# Patient Record
Sex: Male | Born: 1990 | Race: Black or African American | Hispanic: No | Marital: Single | Smoking: Never smoker
Health system: Southern US, Community
[De-identification: ages and names within clinical notes are randomized; demographics above are authoritative.]

---
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
## 2013-06-09 ENCOUNTER — Emergency Department (HOSPITAL_COMMUNITY)
Admission: EM | Admit: 2013-06-09 | Discharge: 2013-06-09 | Disposition: A | Discharge status: Home / Self Care | Payer: Managed Care, Other (non HMO) | Attending: Emergency Medicine | Admitting: Emergency Medicine

## 2013-06-09 ENCOUNTER — Encounter (HOSPITAL_COMMUNITY): Payer: Self-pay | Admitting: Emergency Medicine

## 2013-06-09 DIAGNOSIS — L03319 Cellulitis of trunk, unspecified: Principal | ICD-10-CM

## 2013-06-09 DIAGNOSIS — L02219 Cutaneous abscess of trunk, unspecified: Secondary | ICD-10-CM | POA: Insufficient documentation

## 2013-06-09 DIAGNOSIS — L0291 Cutaneous abscess, unspecified: Secondary | ICD-10-CM

## 2013-06-09 MED ORDER — CEPHALEXIN 500 MG PO CAPS: 500.0000 mg | ORAL_CAPSULE | Freq: Four times a day (QID) | ORAL | Status: DC

## 2013-06-09 MED ORDER — TRAMADOL HCL 50 MG PO TABS: 50.0000 mg | ORAL_TABLET | Freq: Four times a day (QID) | ORAL | Status: DC | PRN

## 2013-06-09 NOTE — ED Notes (Signed)
Dressing applied. 

## 2013-06-09 NOTE — ED Provider Notes (Signed)
CSN: [id number]     Arrival date & time 06/09/13  1102 History  This chart was scribed for non-physician practitioner Teressa LowerVrinda Dominque Levandowski, NP working with Ward GivensIva L Knapp, MD by Dorothey Basemania Sutton, ED Scribe. This patient was seen in room TR09C/TR09C and the patient's care was started at 11:28 AM.    Chief Complaint  Patient presents with  . Abscess   The history is provided by the patient. No language interpreter was used.   HPI Comments: Chris Phillips is a 23 y.o. male who presents to the Emergency Department complaining of a painful abscess to the right side of the mid-back onset 3-4 days ago. Patient reports squeezing the area at home with some purulent drainage. He denies prior history of abscesses. He does not remember when his last tetanus vaccination was, but that he believes it was 4 years ago. Patient has no other pertinent medical history.   History reviewed. No pertinent past medical history. History reviewed. No pertinent past surgical history. No family history on file. History  Substance Use Topics  . Smoking status: Never Smoker   . Smokeless tobacco: Not on file  . Alcohol Use: No    Review of Systems  Skin: Positive for wound (abscess).  All other systems reviewed and are negative.    Allergies  Review of patient's allergies indicates no known allergies.  Home Medications  No current outpatient prescriptions on file.  Triage Vitals: BP 123/70  Pulse 58  Temp(Src) 97.5 F (36.4 C) (Oral)  Wt 236 lb (107.049 kg)  SpO2 100%  Physical Exam  Nursing note and vitals reviewed. Constitutional: He is oriented to person, place, and time. He appears well-developed and well-nourished. No distress.  HENT:  Head: Normocephalic and atraumatic.  Eyes: Conjunctivae are normal.  Neck: Normal range of motion. Neck supple.  Cardiovascular: Normal rate, regular rhythm and normal heart sounds.   Pulmonary/Chest: Effort normal and breath sounds normal. No respiratory distress.   Abdominal: He exhibits no distension.  Musculoskeletal: Normal range of motion.  Neurological: He is alert and oriented to person, place, and time.  Skin: Skin is warm and dry.  2 cm, open area of induration to the right side of the mid-back.  Psychiatric: He has a normal mood and affect. His behavior is normal.    ED Course  Procedures (including critical care time)  DIAGNOSTIC STUDIES: Oxygen Saturation is 100% on room air, normal by my interpretation.    COORDINATION OF CARE: 11:30 AM- Discussed that an incision and drainage will not be necessary today in the ED. Will discharge patient with Keflex and pain medication to manage symptoms. Advised patient to apply warm compresses to the area at home. Discussed treatment plan with patient at bedside and patient verbalized agreement.     Labs Review Labs Reviewed - No data to display Imaging Review No results found.   EKG Interpretation None      MDM   Final diagnoses:  Abscess    Abscess draining no i&D needed. Will treat with antibiotics and tramadol   I personally performed the services described in this documentation, which was scribed in my presence. The recorded information has been reviewed and is accurate.     Teressa LowerVrinda Lymon Kidney, NP 06/09/13 [phone]

## 2013-06-09 NOTE — Discharge Instructions (Signed)
Abscess An abscess is an infected area that contains a collection of pus and debris.It can occur in almost any part of the body. An abscess is also known as a furuncle or boil. CAUSES  An abscess occurs when tissue gets infected. This can occur from blockage of oil or sweat glands, infection of hair follicles, or a minor injury to the skin. As the body tries to fight the infection, pus collects in the area and creates pressure under the skin. This pressure causes pain. People with weakened immune systems have difficulty fighting infections and get certain abscesses more often.  SYMPTOMS Usually an abscess develops on the skin and becomes a painful mass that is red, warm, and tender. If the abscess forms under the skin, you may feel a moveable soft area under the skin. Some abscesses break open (rupture) on their own, but most will continue to get worse without care. The infection can spread deeper into the body and eventually into the bloodstream, causing you to feel ill.  DIAGNOSIS  Your caregiver will take your medical history and perform a physical exam. A sample of fluid may also be taken from the abscess to determine what is causing your infection. TREATMENT  Your caregiver may prescribe antibiotic medicines to fight the infection. However, taking antibiotics alone usually does not cure an abscess. Your caregiver may need to make a small cut (incision) in the abscess to drain the pus. In some cases, gauze is packed into the abscess to reduce pain and to continue draining the area. HOME CARE INSTRUCTIONS   Only take over-the-counter or prescription medicines for pain, discomfort, or fever as directed by your caregiver.  If you were prescribed antibiotics, take them as directed. Finish them even if you start to feel better.  If gauze is used, follow your caregiver's directions for changing the gauze.  To avoid spreading the infection:  Keep your draining abscess covered with a  bandage.  Wash your hands well.  Do not share personal care items, towels, or whirlpools with others.  Avoid skin contact with others.  Keep your skin and clothes clean around the abscess.  Keep all follow-up appointments as directed by your caregiver. SEEK MEDICAL CARE IF:   You have increased pain, swelling, redness, fluid drainage, or bleeding.  You have muscle aches, chills, or a general ill feeling.  You have a fever. MAKE SURE YOU:   Understand these instructions.  Will watch your condition.  Will get help right away if you are not doing well or get worse. Document Released: 01/02/2005 Document Revised: 09/24/2011 Document Reviewed: 06/07/2011 [employer] Patient Information 2014 [employer].  

## 2013-06-09 NOTE — ED Notes (Signed)
Pt has an abscess to R upper side.  Pt also has several small areas to R upper arm (recent tattoo) and on back.

## 2013-06-10 NOTE — ED Provider Notes (Signed)
Medical screening examination/treatment/procedure(s) were performed by non-physician practitioner and as supervising physician I was immediately available for consultation/collaboration.   EKG Interpretation None      Verdella Laidlaw, MD, FACEP   Odalys Win L Johnpatrick Jenny, MD 06/10/13 1942 

## 2013-10-19 ENCOUNTER — Emergency Department (HOSPITAL_COMMUNITY): Payer: Managed Care, Other (non HMO)

## 2013-10-19 ENCOUNTER — Encounter (HOSPITAL_COMMUNITY): Payer: Self-pay | Admitting: Emergency Medicine

## 2013-10-19 ENCOUNTER — Emergency Department (HOSPITAL_COMMUNITY)
Admission: EM | Admit: 2013-10-19 | Discharge: 2013-10-19 | Disposition: A | Discharge status: Home / Self Care | Payer: Managed Care, Other (non HMO) | Attending: Emergency Medicine | Admitting: Emergency Medicine

## 2013-10-19 DIAGNOSIS — S93401A Sprain of unspecified ligament of right ankle, initial encounter: Secondary | ICD-10-CM

## 2013-10-19 DIAGNOSIS — S93409A Sprain of unspecified ligament of unspecified ankle, initial encounter: Secondary | ICD-10-CM | POA: Insufficient documentation

## 2013-10-19 DIAGNOSIS — Y9367 Activity, basketball: Secondary | ICD-10-CM | POA: Insufficient documentation

## 2013-10-19 DIAGNOSIS — Y9289 Other specified places as the place of occurrence of the external cause: Secondary | ICD-10-CM | POA: Insufficient documentation

## 2013-10-19 DIAGNOSIS — X500XXA Overexertion from strenuous movement or load, initial encounter: Secondary | ICD-10-CM | POA: Insufficient documentation

## 2013-10-19 MED ORDER — HYDROCODONE-ACETAMINOPHEN 5-325 MG PO TABS: 1.0000 | ORAL_TABLET | ORAL | Status: AC | PRN

## 2013-10-19 MED ORDER — HYDROCODONE-ACETAMINOPHEN 5-325 MG PO TABS
2.0000 | ORAL_TABLET | Freq: Once | ORAL | Status: AC
  Administered 2013-10-19: 2 via ORAL
  Filled 2013-10-19: qty 2

## 2013-10-19 NOTE — ED Notes (Signed)
He was playing basketball last night and rolled his R ankle and having pain in it since. He fractured this ankle in th past

## 2013-10-19 NOTE — Discharge Instructions (Signed)
Use ankle brace for support. Take Vicodin as needed for pain. Rest, ice and elevate your ankle. Refer to attached documents for more information.

## 2013-10-19 NOTE — ED Provider Notes (Signed)
CSN: [id number]     Arrival date & time 10/19/13  0950 History  This chart was scribed for non-physician practitioner, Emilia BeckKaitlyn Nayson Traweek, PA-C working with Raeford RazorStephen Kohut, MD by Greggory StallionKayla Andersen, ED scribe. This patient was seen in room TR05C/TR05C and the patient's care was started at 10:51 AM.   Chief Complaint  Patient presents with  . Ankle Pain   The history is provided by the patient. No language interpreter was used.   HPI Comments: Chris Phillips is a 23 y.o. male who presents to the Emergency Department complaining of right ankle injury that occurred last night. States he was playing basketball and rolled his ankle. He has sudden onset pain with associated swelling. Bearing weight and certain movements worsen the pain. Denies knee pain. Pt has fractured his right ankle in the past.   History reviewed. No pertinent past medical history. History reviewed. No pertinent past surgical history. History reviewed. No pertinent family history. History  Substance Use Topics  . Smoking status: Never Smoker   . Smokeless tobacco: Not on file  . Alcohol Use: No    Review of Systems  Musculoskeletal: Positive for arthralgias and joint swelling.  All other systems reviewed and are negative.  Allergies  Review of patient's allergies indicates no known allergies.  Home Medications   Prior to Admission medications   Medication Sig Start Date End Date Taking? Authorizing Provider  naphazoline-pheniramine (NAPHCON-A) 0.025-0.3 % ophthalmic solution Place 1 drop into both eyes 2 (two) times daily as needed for irritation.   Yes Historical Provider, MD   BP 121/73  Pulse 63  Temp(Src) 98.5 F (36.9 C) (Oral)  Resp 15  Ht 6\' 2"  (1.88 m)  Wt 222 lb (100.699 kg)  BMI 28.49 kg/m2  SpO2 99%  Physical Exam  Nursing note and vitals reviewed. Constitutional: He is oriented to person, place, and time. He appears well-developed and well-nourished. No distress.  HENT:  Head: Normocephalic and  atraumatic.  Eyes: Conjunctivae and EOM are normal.  Neck: Neck supple. No tracheal deviation present.  Cardiovascular: Normal rate.   Pulmonary/Chest: Effort normal. No respiratory distress.  Musculoskeletal: Normal range of motion.  Right lateral malleolar tenderness to palpation with associated edema. Limited ROM of right ankle due to pain and swelling. Pt is able to wiggle toes.   Neurological: He is alert and oriented to person, place, and time.  Skin: Skin is warm and dry.  Psychiatric: He has a normal mood and affect. His behavior is normal.    ED Course  Procedures (including critical care time)  DIAGNOSTIC STUDIES: Oxygen Saturation is 99% on RA, normal by my interpretation.    COORDINATION OF CARE: 10:52 AM-Discussed treatment plan which includes pain medications, RICE, ankle brace and crutches with pt at bedside and pt agreed to plan.   SPLINT APPLICATION Date/Time: 2:36 PM Authorized by: Emilia BeckKaitlyn Tyler Robidoux Consent: Verbal consent obtained. Risks and benefits: risks, benefits and alternatives were discussed Consent given by: patient Splint applied by: orthopedic technician Location details: right ankle  Splint type: ASO Supplies used: ASO Post-procedure: The splinted body part was neurovascularly unchanged following the procedure. Patient tolerance: Patient tolerated the procedure well with no immediate complications.     Labs Review Labs Reviewed - No data to display  Imaging Review Dg Ankle Complete Right  10/19/2013   CLINICAL DATA:  Basketball injury right ankle.  Pain and swelling.  EXAM: RIGHT ANKLE - COMPLETE 3+ VIEW  COMPARISON:  Plain films right ankle 07/03/2011.  FINDINGS: There  is some soft tissue swelling about the ankle. No acute bony or joint abnormality is identified. There is no tibiotalar joint effusion.  IMPRESSION: Mild appearing soft tissue swelling without underlying bony or joint abnormality.   Electronically Signed   By: Drusilla Kanner  M.D.   On: 10/19/2013 10:37     EKG Interpretation None      MDM   Final diagnoses:  Right ankle sprain, initial encounter    10:58 AM Patient's xray unremarkable for acute changes. Patient likely has a right ankle sprain. Patient will have ASO brace and crutches.   I personally performed the services described in this documentation, which was scribed in my presence. The recorded information has been reviewed and is accurate.  Emilia Beck, PA-C 10/19/13 1436

## 2013-10-21 NOTE — ED Provider Notes (Signed)
Medical screening examination/treatment/procedure(s) were performed by non-physician practitioner and as supervising physician I was immediately available for consultation/collaboration.   EKG Interpretation None       Mario Coronado, MD 10/21/13 0722 

## 2015-02-20 IMAGING — CR DG ANKLE COMPLETE 3+V*R*
3 series · 3 of 3 positions shown · non-contrast
Comparison: Plain films right ankle 07/03/2011.

CLINICAL DATA: Basketball injury right ankle.  Pain and swelling.

EXAM:
RIGHT ANKLE - COMPLETE 3+ VIEW

[t ankle joint ap right]
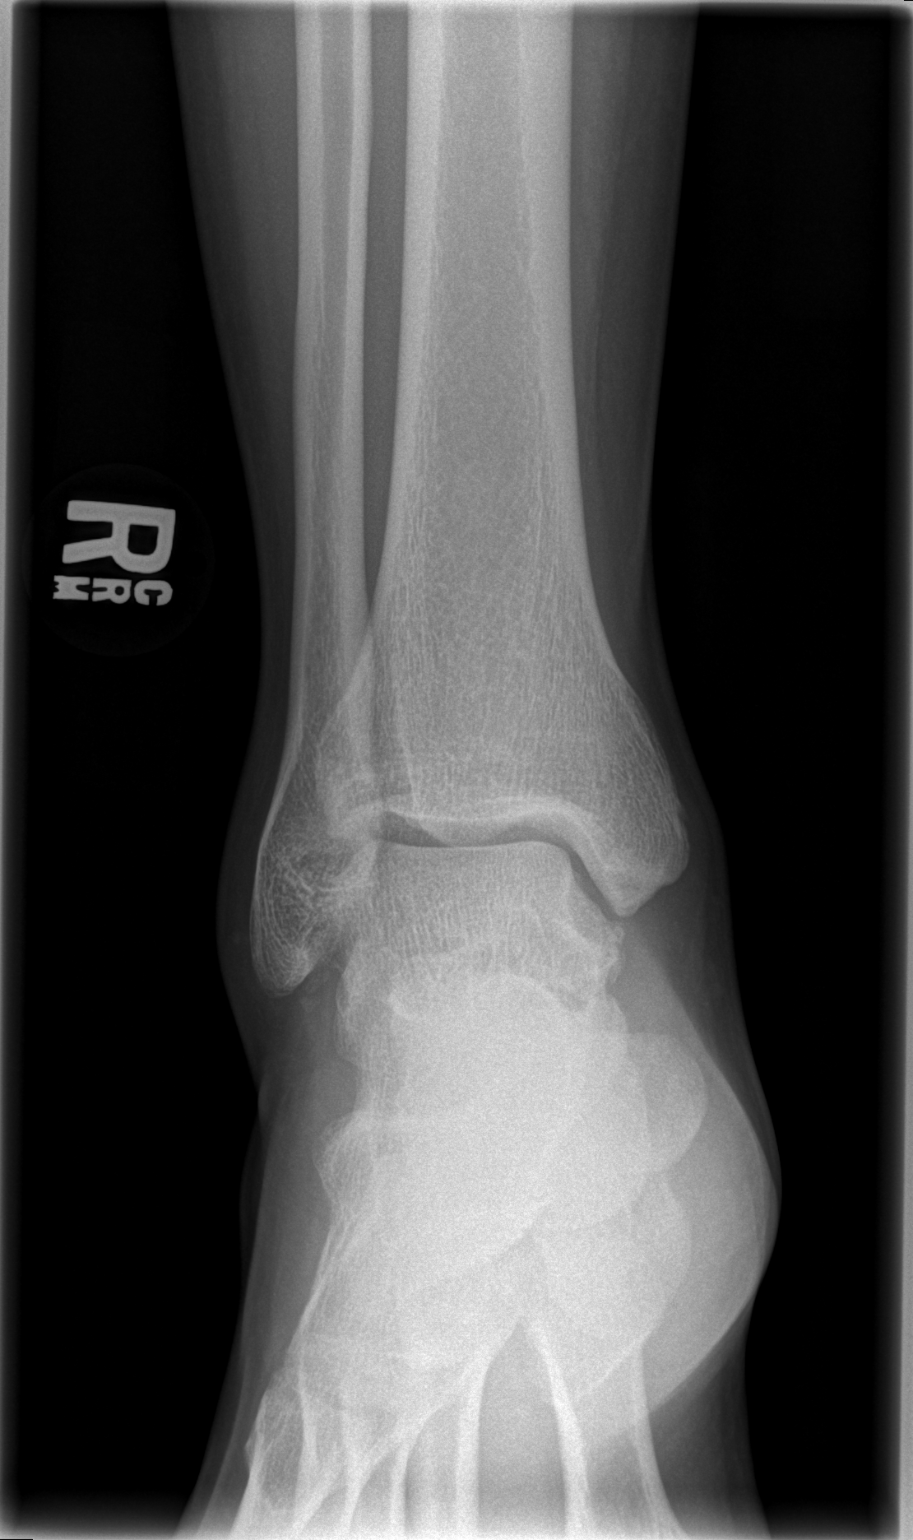

[t ankle joint oblique right]
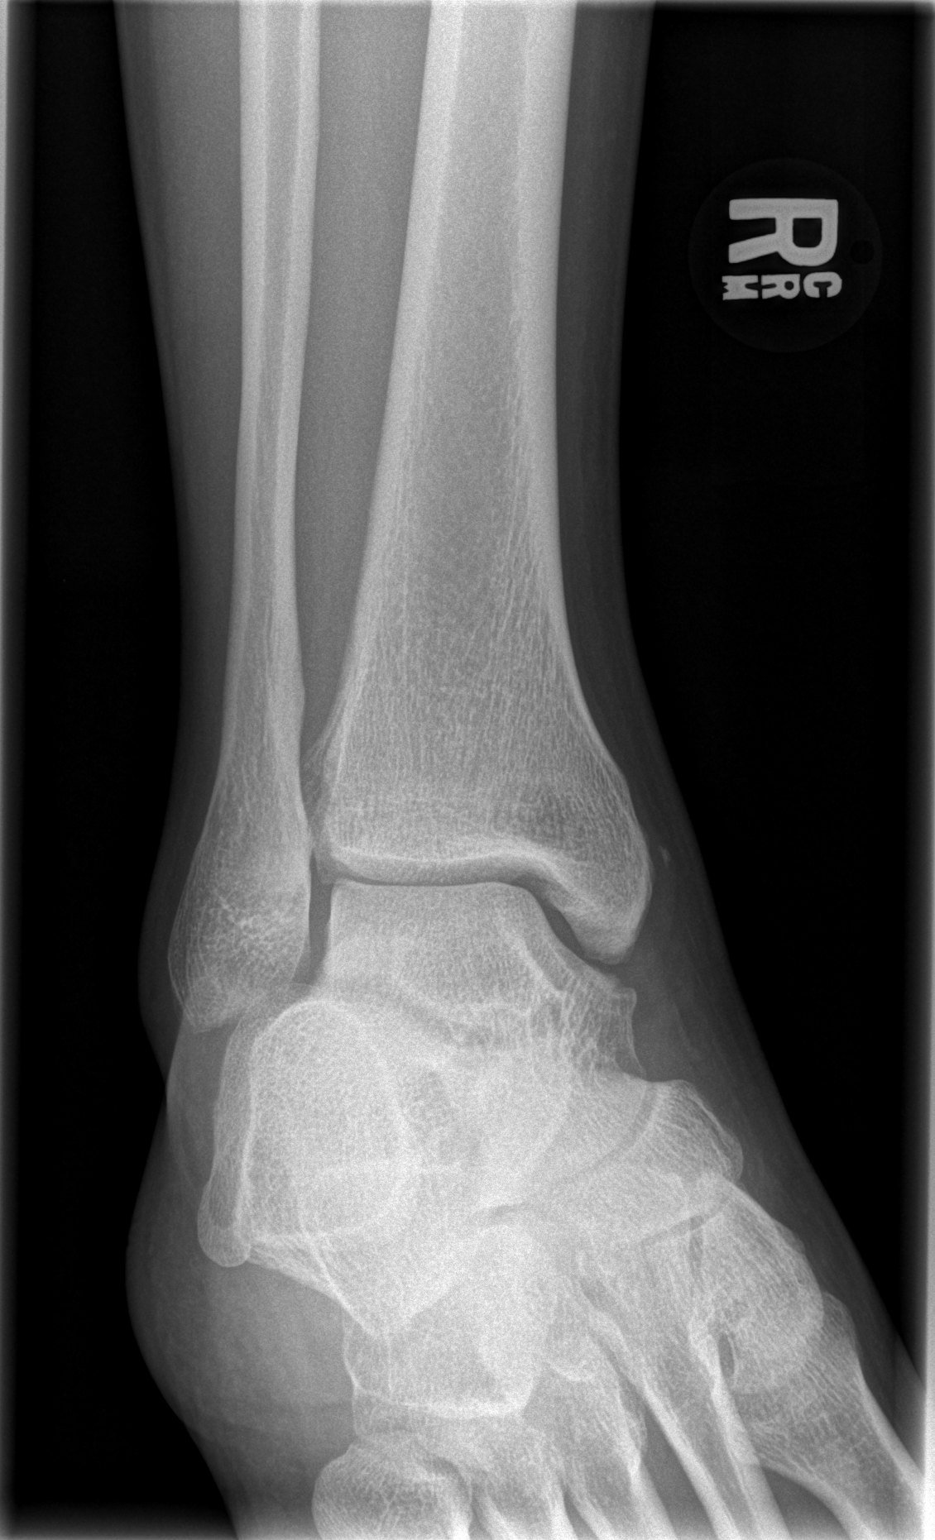

[t ankle joint lat right]
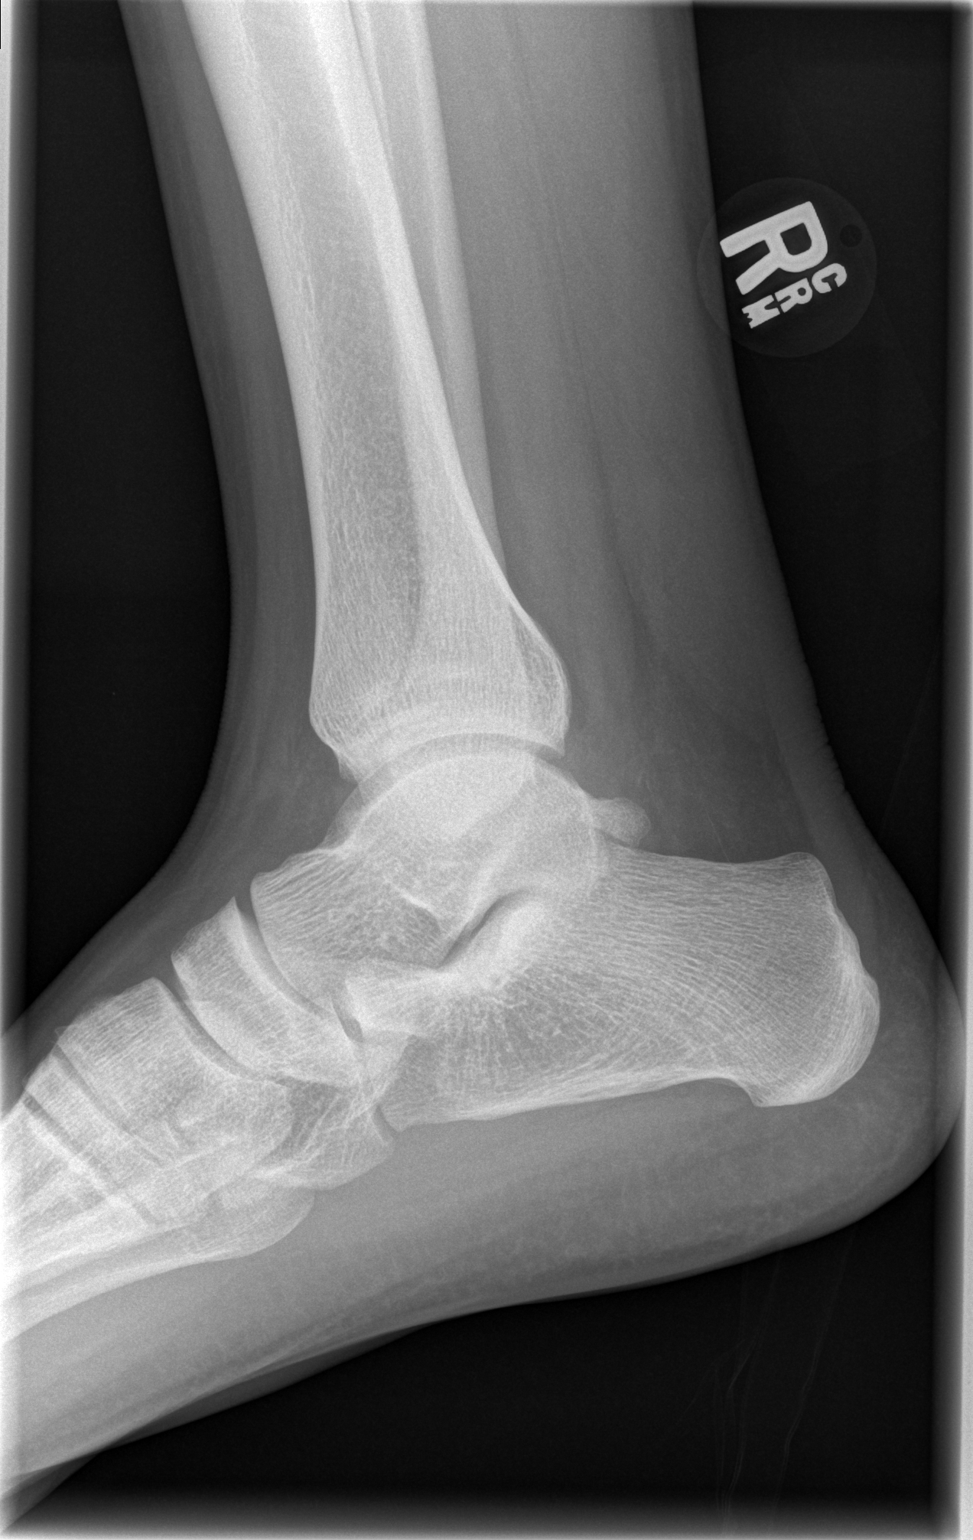

[3 of 3 positions shown; findings below may reference images not displayed]

FINDINGS: There is some soft tissue swelling about the ankle. No acute bony or
joint abnormality is identified. There is no tibiotalar joint
effusion.
IMPRESSION: Mild appearing soft tissue swelling without underlying bony or joint
abnormality.

## 2021-06-15 ENCOUNTER — Other Ambulatory Visit: Payer: Self-pay

## 2021-06-15 ENCOUNTER — Ambulatory Visit (HOSPITAL_COMMUNITY)
Admission: EM | Admit: 2021-06-15 | Discharge: 2021-06-15 | Disposition: A | Discharge status: Home / Self Care | Payer: Managed Care, Other (non HMO) | Attending: Nurse Practitioner | Admitting: Nurse Practitioner

## 2021-06-15 ENCOUNTER — Encounter (HOSPITAL_COMMUNITY): Payer: Self-pay | Admitting: Emergency Medicine

## 2021-06-15 DIAGNOSIS — N3001 Acute cystitis with hematuria: Secondary | ICD-10-CM

## 2021-06-15 DIAGNOSIS — Z113 Encounter for screening for infections with a predominantly sexual mode of transmission: Secondary | ICD-10-CM

## 2021-06-15 LAB — POCT URINALYSIS DIPSTICK, ED / UC
Glucose, UA: NEGATIVE mg/dL
Ketones, ur: 15 mg/dL — AB
Nitrite: NEGATIVE
Protein, ur: 100 mg/dL — AB
Specific Gravity, Urine: 1.02 (ref 1.005–1.030)
Urobilinogen, UA: 4 mg/dL — ABNORMAL HIGH (ref 0.0–1.0)
pH: 7 (ref 5.0–8.0)

## 2021-06-15 MED ORDER — SULFAMETHOXAZOLE-TRIMETHOPRIM 800-160 MG PO TABS: 1.0000 | ORAL_TABLET | Freq: Two times a day (BID) | ORAL | 0 refills | Status: DC

## 2021-06-15 NOTE — ED Provider Notes (Signed)
?[facility] ? ? ? ?CSN: [id number] ?Arrival date & time: 06/15/21  1212 ? ? ?  ? ?History   ?Chief Complaint ?Chief Complaint  ?Patient presents with  ? Dysuria  ? ? ?HPI ?Chris Phillips is a 31 y.o. male.  ? ?Patient reports 2-day history of burning at the end of urination.  He denies hematuria, increased urinary frequency or urgency, incontinence, malodorous urine.  He also denies abdominal pain, inguinal lymphadenopathy, fevers, nausea/vomiting, back pain, suprapubic pressure.  He denies penile discharge. ? ?He is also sexually active and would like to be screened for STI today. ? ? ?History reviewed. No pertinent past medical history. ? ?There are no problems to display for this patient. ? ? ?History reviewed. No pertinent surgical history. ? ? ? ? ?Home Medications   ? ?Prior to Admission medications   ?Medication Sig Start Date End Date Taking? Authorizing Provider  ?sulfamethoxazole-trimethoprim (BACTRIM DS) 800-160 MG tablet Take 1 tablet by mouth 2 (two) times daily. 06/15/21  Yes Valentino Nose, NP  ?HYDROcodone-acetaminophen (NORCO/VICODIN) 5-325 MG per tablet Take 1-2 tablets by mouth every 4 (four) hours as needed for moderate pain or severe pain. 10/19/13   Emilia Beck, PA-C  ?naphazoline-pheniramine (NAPHCON-A) 0.025-0.3 % ophthalmic solution Place 1 drop into both eyes 2 (two) times daily as needed for irritation.    [provider]  ? ? ?Family History ?No family history on file. ? ?Social History ?Social History  ? ?Tobacco Use  ? Smoking status: Never  ?Substance Use Topics  ? Alcohol use: No  ? Drug use: No  ? ? ? ?Allergies   ?Patient has no known allergies. ? ? ?Review of Systems ?Review of Systems ?Per HPI ? ?Physical Exam ?Triage Vital Signs ?ED Triage Vitals  ?Enc Vitals Group  ?   BP 06/15/21 1315 124/75  ?   Pulse Rate 06/15/21 1315 (!) 52  ?   Resp 06/15/21 1315 16  ?   Temp 06/15/21 1315 98.6 ?F (37 ?C)  ?   Temp Source 06/15/21 1315 Oral  ?   SpO2 06/15/21  1315 97 %  ?   Weight --   ?   Height --   ?   Head Circumference --   ?   Peak Flow --   ?   Pain Score 06/15/21 1314 3  ?   Pain Loc --   ?   Pain Edu? --   ?   Excl. in GC? --   ? ?No data found. ? ?Updated Vital Signs ?BP 124/75   Pulse (!) 52   Temp 98.6 ?F (37 ?C) (Oral)   Resp 16   SpO2 97%  ? ?Visual Acuity ?Right Eye Distance:   ?Left Eye Distance:   ?Bilateral Distance:   ? ?Right Eye Near:   ?Left Eye Near:    ?Bilateral Near:    ? ?Physical Exam ?Vitals and nursing note reviewed.  ?Constitutional:   ?   General: He is not in acute distress. ?   Appearance: Normal appearance. He is not toxic-appearing.  ?Cardiovascular:  ?   Rate and Rhythm: Regular rhythm. Bradycardia present.  ?Pulmonary:  ?   Effort: Pulmonary effort is normal. No respiratory distress.  ?   Breath sounds: Normal breath sounds. No wheezing, rhonchi or rales.  ?Abdominal:  ?   General: Abdomen is flat. Bowel sounds are normal.  ?   Palpations: Abdomen is soft.  ?   Tenderness: There is no right  CVA tenderness or left CVA tenderness.  ?Genitourinary: ?   Comments: Deferred ?Skin: ?   General: Skin is warm and dry.  ?   Capillary Refill: Capillary refill takes less than 2 seconds.  ?   Coloration: Skin is not jaundiced or pale.  ?   Findings: No erythema.  ?Neurological:  ?   Mental Status: He is alert and oriented to person, place, and time.  ?   Motor: No weakness.  ?   Gait: Gait normal.  ?Psychiatric:     ?   Mood and Affect: Mood normal.     ?   Behavior: Behavior normal.     ?   Thought Content: Thought content normal.     ?   Judgment: Judgment normal.  ? ? ? ?UC Treatments / Results  ?Labs ?(all labs ordered are listed, but only abnormal results are displayed) ?Labs Reviewed  ?POCT URINALYSIS DIPSTICK, ED / UC - Abnormal; Notable for the following components:  ?    Result Value  ? Bilirubin Urine SMALL (*)   ? Ketones, ur 15 (*)   ? Hgb urine dipstick MODERATE (*)   ? Protein, ur 100 (*)   ? Urobilinogen, UA 4.0 (*)   ?  Leukocytes,Ua SMALL (*)   ? All other components within normal limits  ?URINE CULTURE  ?RPR  ?HIV ANTIBODY (ROUTINE TESTING W REFLEX)  ?CYTOLOGY, (ORAL, ANAL, URETHRAL) ANCILLARY ONLY  ? ? ?EKG ? ? ?Radiology ?No results found. ? ?Procedures ?Procedures (including critical care time) ? ?Medications Ordered in UC ?Medications - No data to display ? ?Initial Impression / Assessment and Plan / UC Course  ?I have reviewed the triage vital signs and the nursing notes. ? ?Pertinent labs & imaging results that were available during my care of the patient were reviewed by me and considered in my medical decision making (see chart for details). ? ?  ?UA dipstick today concerning for UTI.  Will treat with Bactrim DS twice daily for 7 days.  Will also screen for STI per patient request and notify and treat with positive results.  ?Final Clinical Impressions(s) / UC Diagnoses  ? ?Final diagnoses:  ?Acute cystitis with hematuria  ?Routine screening for STI (sexually transmitted infection)  ? ? ? ?Discharge Instructions   ? ?  ?Please take the Bactrim DS twice daily for 7 days for the urinary infection.  If your symptoms worsen, please seek emergent care.  We will let you know with any positive findings from the STI screening.  Please wear condoms with every sexual encounter.  ? ? ? ? ?ED Prescriptions   ? ? Medication Sig Dispense Auth. Provider  ? sulfamethoxazole-trimethoprim (BACTRIM DS) 800-160 MG tablet Take 1 tablet by mouth 2 (two) times daily. 14 tablet Valentino Nose, NP  ? ?  ? ?PDMP not reviewed this encounter. ?  ?Valentino Nose, NP ?06/15/21 1433 ? ?

## 2021-06-15 NOTE — Discharge Instructions (Addendum)
Please take the Bactrim DS twice daily for 7 days for the urinary infection.  If your symptoms worsen, please seek emergent care.  We will let you know with any positive findings from the STI screening.  Please wear condoms with every sexual encounter.  ?

## 2021-06-15 NOTE — ED Triage Notes (Signed)
PT reports dysuria at end of urine stream for 2 days ?

## 2021-06-17 LAB — URINE CULTURE: Culture: NO GROWTH

## 2021-06-18 LAB — CYTOLOGY, (ORAL, ANAL, URETHRAL) ANCILLARY ONLY
Chlamydia: POSITIVE — AB
Comment: NEGATIVE
Comment: NEGATIVE
Comment: NORMAL
Neisseria Gonorrhea: NEGATIVE
Trichomonas: NEGATIVE

## 2021-06-19 ENCOUNTER — Telehealth (HOSPITAL_COMMUNITY): Payer: Self-pay | Admitting: Emergency Medicine

## 2021-06-19 MED ORDER — DOXYCYCLINE HYCLATE 100 MG PO CAPS: 100.0000 mg | ORAL_CAPSULE | Freq: Two times a day (BID) | ORAL | 0 refills | Status: AC
